# Patient Record
Sex: Male | Born: 1995 | Race: White | Hispanic: No | Marital: Single | Smoking: Never smoker
Health system: Southern US, Community
[De-identification: ages and names within clinical notes are randomized; demographics above are authoritative.]

## PROBLEM LIST (undated history)

## (undated) DIAGNOSIS — S060XAA Concussion with loss of consciousness status unknown, initial encounter: Secondary | ICD-10-CM

## (undated) DIAGNOSIS — J302 Other seasonal allergic rhinitis: Secondary | ICD-10-CM

## (undated) DIAGNOSIS — S060X9A Concussion with loss of consciousness of unspecified duration, initial encounter: Secondary | ICD-10-CM

## (undated) DIAGNOSIS — A770 Spotted fever due to Rickettsia rickettsii: Secondary | ICD-10-CM

## (undated) DIAGNOSIS — M545 Low back pain, unspecified: Secondary | ICD-10-CM

## (undated) HISTORY — DX: Concussion with loss of consciousness of unspecified duration, initial encounter: S06.0X9A

## (undated) HISTORY — DX: Other seasonal allergic rhinitis: J30.2

## (undated) HISTORY — DX: Low back pain, unspecified: M54.50

## (undated) HISTORY — DX: Low back pain: M54.5

## (undated) HISTORY — DX: Concussion with loss of consciousness status unknown, initial encounter: S06.0XAA

## (undated) HISTORY — DX: Spotted fever due to Rickettsia rickettsii: A77.0

---
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
## 2017-06-19 ENCOUNTER — Telehealth: Payer: Self-pay | Admitting: Family Medicine

## 2017-06-19 NOTE — Telephone Encounter (Signed)
 if possible, December is fine.  Tell him I hope his fall semester goes well.  Thanks.

## 2017-06-19 NOTE — Telephone Encounter (Signed)
Best number [phone]   Mom called to r/s new patient with you.  Pt started @ [employer] and he goes to school all day and wanted to r/s   Your next new patient appointment is 03/06/18.  Mom wanted to know if you could see pt in Dec  He has a break from school the whole month of dec  Ok to schedule?

## 2017-06-19 NOTE — Telephone Encounter (Signed)
Left message asking pt to call office  °

## 2017-06-20 ENCOUNTER — Ambulatory Visit: Payer: Self-pay | Admitting: Family Medicine

## 2017-06-20 NOTE — Telephone Encounter (Signed)
appointment 12/4  Pt aware

## 2017-09-26 ENCOUNTER — Ambulatory Visit (INDEPENDENT_AMBULATORY_CARE_PROVIDER_SITE_OTHER): Payer: Self-pay | Admitting: Family Medicine

## 2017-09-26 ENCOUNTER — Encounter: Payer: Self-pay | Admitting: Family Medicine

## 2017-09-26 DIAGNOSIS — Z7189 Other specified counseling: Secondary | ICD-10-CM

## 2017-09-26 DIAGNOSIS — Z0000 Encounter for general adult medical examination without abnormal findings: Secondary | ICD-10-CM

## 2017-09-26 NOTE — Patient Instructions (Addendum)
Use debrox to loosen the wax if needed then irrigate with warm water.  Don't use Qtips.   Thanks for getting a flu shot.  Update me as needed.  No charge for visit.
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
## 2017-10-04 ENCOUNTER — Encounter: Payer: Self-pay | Admitting: Family Medicine

## 2017-10-04 DIAGNOSIS — Z7189 Other specified counseling: Secondary | ICD-10-CM | POA: Insufficient documentation

## 2017-10-04 DIAGNOSIS — Z0000 Encounter for general adult medical examination without abnormal findings: Secondary | ICD-10-CM | POA: Insufficient documentation

## 2017-10-04 NOTE — Assessment & Plan Note (Signed)
HIV screen discussed with patient.  He will consider. Tetanus shot done approximately 2013. Flu shot done 07/2027 Advanced directive discussed with patient.  Parents equally designated if patient were incapacitated. Healthy habits discussed with patient.  The patient mainly presented today to get established with the clinic.  He has no acute concerns.  His health maintenance is otherwise up-to-date and he needs no intervention at this point.  I no-charged the visit. D/w pt.  Update me as needed.

## 2017-10-04 NOTE — Assessment & Plan Note (Signed)
Advanced directive discussed with patient.  Parents equally designated if patient were incapacitated. 

## 2017-11-17 ENCOUNTER — Encounter: Payer: Self-pay | Admitting: Family Medicine

## 2017-11-17 ENCOUNTER — Ambulatory Visit: Payer: Self-pay | Admitting: Family Medicine

## 2017-11-17 ENCOUNTER — Ambulatory Visit (INDEPENDENT_AMBULATORY_CARE_PROVIDER_SITE_OTHER)
Admission: RE | Admit: 2017-11-17 | Discharge: 2017-11-17 | Disposition: A | Discharge status: Home / Self Care | Payer: Self-pay | Source: Ambulatory Visit | Attending: Family Medicine | Admitting: Family Medicine

## 2017-11-17 VITALS — BP 96/68 | HR 70 | Temp 98.6°F | Wt 153.8 lb

## 2017-11-17 DIAGNOSIS — M79672 Pain in left foot: Secondary | ICD-10-CM

## 2017-11-17 DIAGNOSIS — M79606 Pain in leg, unspecified: Secondary | ICD-10-CM

## 2017-11-17 NOTE — Patient Instructions (Addendum)
See if a chopat strap helps with the right leg pain.  Ice the area, 5 minutes at a time.   I don't see a fracture on your films.  We'll get the radiology department to overread it.   Try a lace up ankle brace in the meantime.  Take care.  Glad to see you.  Update me as needed.

## 2017-11-17 NOTE — Progress Notes (Signed)
R proximal tibial pain.  Was playing street hockey.  Hit by the ball in the area.  Was able to keep playing.  This was a few weeks ago.  Able to bear weight.  Also had rolled his L ankle around the same time.  Felt a pop at the time, this was a few weeks ago. Pain with running or with rolling the L foot.  Still able to bear weight.  He wanted to let the areas heal up but haven't resolved in the meantime, so he came in for evaluation.  Meds, vitals, and allergies reviewed.   ROS: Per HPI unless specifically indicated in ROS section   nad ncat R knee with normal inspection, normal range of motion.  Joint line nontender to palpation.  Patella not tender.  Normal patellar tracking.  ACL MCL and LCL all feel solid. Able to do R one- legged squat.  Minimal tenderness near the tibial tubercle.   Left foot and ankle with normal inspection.  Mortise feels solid.  Medial and lateral malleoli are not tender.  No bruising.  No swelling.  Normal dorsalis pedis pulse.  Foot is not tender to palpation initially but some pain with weight bearing on the L foot, mid foot.

## 2017-11-19 DIAGNOSIS — M79606 Pain in leg, unspecified: Secondary | ICD-10-CM | POA: Insufficient documentation

## 2017-11-19 DIAGNOSIS — M79672 Pain in left foot: Secondary | ICD-10-CM | POA: Insufficient documentation

## 2017-11-19 NOTE — Assessment & Plan Note (Addendum)
I don't see a fracture on his films.  Likely a strain/soft tissue injury.  See notes on imaging.  Okay to try a lace up ankle brace in the meantime. Update me as needed.  He agrees.

## 2017-11-19 NOTE — Assessment & Plan Note (Signed)
He could have had irritation at the insertion of the quad ligament.  Unlikely to have a fracture given his exam and timeline. He can see if a chopat strap helps with the right leg pain.  Ice the area, 5 minutes at a time.   Update me as needed.  He agrees.

## 2017-12-13 ENCOUNTER — Ambulatory Visit: Payer: Self-pay | Admitting: Family Medicine

## 2017-12-13 ENCOUNTER — Encounter: Payer: Self-pay | Admitting: Family Medicine

## 2017-12-13 VITALS — BP 120/68 | HR 90 | Temp 98.9°F | Wt 148.0 lb

## 2017-12-13 DIAGNOSIS — A084 Viral intestinal infection, unspecified: Secondary | ICD-10-CM | POA: Insufficient documentation

## 2017-12-13 MED ORDER — ONDANSETRON HCL 4 MG PO TABS: 4.0000 mg | ORAL_TABLET | Freq: Three times a day (TID) | ORAL | 0 refills | Status: DC | PRN

## 2017-12-13 NOTE — Progress Notes (Signed)
BP 120/68 (BP Location: Left Arm, Patient Position: Sitting, Cuff Size: Normal)   Pulse 90   Temp 98.9 F (37.2 C) (Oral)   Wt 148 lb (67.1 kg)   SpO2 97%   BMI 22.18 kg/m    CC: diarrhea Subjective:    Patient ID: Jeffrey Church, male    DOB: 01/03/1996, 22 y.o.   MRN: [id number]  HPI: Jeffrey BladeJonathan Grogan is a 22 y.o. male presenting on 12/13/2017 for Diarrhea (Started 3 days ago, not improving.  Has had a lot of gas. Tried Imodium. Pt accompanied by mother.); Emesis (Started last night.); Fatigue; and Fever (Max, 100.7 this morning. Pt recently exposed to flu.)   Here with mom Bess.  3d h/o watery diarrhea 6-7 times/day, nausea/vomiting last night 2am (NBNB), upset stomach and bloating "like gas", fever to 100.7 this morning. Ongoing HA for 3 days as well. Felt better with passing gas. Fatigued. Appetite good.   No blood in stool, cough, ST, ear or tooth pain, body aches.  So far tried imodium with some improvement - up to 4 a day without improvement.  Thinks staying well hydrated. Has tried pedialyte.   [facility] [employer] - sick contacts at school.  No new restaurants, no new foods.  No sick contacts at home.  Uses well water.  Has been able to go to class through this week.   Relevant past medical, surgical, family and social history reviewed and updated as indicated. Interim medical history since our last visit reviewed. Allergies and medications reviewed and updated. Outpatient Medications Prior to Visit  Medication Sig Dispense Refill  . cetirizine (ZYRTEC) 10 MG tablet Take 10 mg by mouth daily as needed for allergies.    . diphenhydrAMINE (BENADRYL) 25 MG tablet Take 25 mg by mouth as needed.     No facility-administered medications prior to visit.      Per HPI unless specifically indicated in ROS section below Review of Systems     Objective:    BP 120/68 (BP Location: Left Arm, Patient Position: Sitting, Cuff Size: Normal)   Pulse 90   Temp 98.9 F (37.2 C)  (Oral)   Wt 148 lb (67.1 kg)   SpO2 97%   BMI 22.18 kg/m   Wt Readings from Last 3 Encounters:  12/13/17 148 lb (67.1 kg) (42 %, Z= -0.21)*  11/17/17 153 lb 12 oz (69.7 kg) (52 %, Z= 0.04)*  09/26/17 155 lb 4 oz (70.4 kg) (55 %, Z= 0.12)*   * Growth percentiles are based on CDC (Boys, 2-20 Years) data.    Physical Exam  Constitutional: He appears well-developed and well-nourished. No distress.  HENT:  Head: Normocephalic and atraumatic.  Right Ear: Hearing, tympanic membrane, external ear and ear canal normal.  Left Ear: Hearing, tympanic membrane, external ear and ear canal normal.  Nose: Nose normal. No mucosal edema or rhinorrhea. Right sinus exhibits no maxillary sinus tenderness and no frontal sinus tenderness. Left sinus exhibits no maxillary sinus tenderness and no frontal sinus tenderness.  Mouth/Throat: Uvula is midline, oropharynx is clear and moist and mucous membranes are normal. No oropharyngeal exudate, posterior oropharyngeal edema, posterior oropharyngeal erythema or tonsillar abscesses.  Eyes: Conjunctivae and EOM are normal. Pupils are equal, round, and reactive to light. No scleral icterus.  Neck: Normal range of motion. Neck supple.  Cardiovascular: Normal rate, regular rhythm, normal heart sounds and intact distal pulses.  No murmur heard. Pulmonary/Chest: Effort normal and breath sounds normal. No respiratory distress. He has no  wheezes. He has no rales.  Abdominal: Soft. Normal appearance and bowel sounds are normal. He exhibits no distension and no mass. There is no hepatosplenomegaly. There is generalized tenderness (mild). There is no rigidity, no rebound, no guarding, no CVA tenderness and negative Murphy's sign.  Lymphadenopathy:    He has no cervical adenopathy.  Skin: Skin is warm and dry. No rash noted.  Psychiatric: He has a normal mood and affect.  Nursing note and vitals reviewed.  No results found for this or any previous visit.    Assessment &  Plan:   Problem List Items Addressed This Visit    Viral gastroenteritis - Primary    Diarrhea x 3 days that may be improving today (more formed stools this morning) as well as isolated episode of emesis this am. Anticipate viral gastroenteritis. Supportive care reviewed. zofran PRN nausea, zantac for stomach upset, continue imodium PRN. Red flags to update [place] for GI pathogen panel reviewed.  Pt and mom agree with plan.           Meds ordered this encounter  Medications  . ondansetron (ZOFRAN) 4 MG tablet    Sig: Take 1 tablet (4 mg total) by mouth every 8 (eight) hours as needed for nausea or vomiting.    Dispense:  20 tablet    Refill:  0   No orders of the defined types were placed in this encounter.   Follow up plan: Return if symptoms worsen or fail to improve.  Eustaquio Boyden, MD

## 2017-12-13 NOTE — Assessment & Plan Note (Addendum)
Diarrhea x 3 days that may be improving today (more formed stools this morning) as well as isolated episode of emesis this am. Anticipate viral gastroenteritis. Supportive care reviewed. zofran PRN nausea, zantac for stomach upset, continue imodium PRN. Red flags to update us for GI pathogen panel reviewed.  Pt and mom agree with plan.

## 2017-12-13 NOTE — Patient Instructions (Signed)
I think you have viral stomach bug - treat supportively. Push sips of fluids, clear liquids over rest of day then advance diet as tolerated (bland diet) May use zofran anti nausea medicine as well as over the counter zantac for upset stomach.  Let us know if not improving over next 24-48 hours or if persistent fever >101 or diarrhea/vomiting.  Viral Gastroenteritis, Adult Viral gastroenteritis is also known as the stomach flu. This condition is caused by various viruses. These viruses can be passed from person to person very easily (are very contagious). This condition may affect your stomach, small intestine, and large intestine. It can cause sudden watery diarrhea, fever, and vomiting. Diarrhea and vomiting can make you feel weak and cause you to become dehydrated. You may not be able to keep fluids down. Dehydration can make you tired and thirsty, cause you to have a dry mouth, and decrease how often you urinate. Older adults and people with other diseases or a weak immune system are at higher risk for dehydration. It is important to replace the fluids that you lose from diarrhea and vomiting. If you become severely dehydrated, you may need to get fluids through an IV tube. What are the causes? Gastroenteritis is caused by various viruses, including rotavirus and norovirus. Norovirus is the most common cause in adults. You can get sick by eating food, drinking water, or touching a surface contaminated with one of these viruses. You can also get sick from sharing utensils or other personal items with an infected person. What increases the risk? This condition is more likely to develop in people:  Who have a weak defense system (immune system).  Who live with one or more children who are younger than 67 years old.  Who live in a nursing home.  Who go on cruise ships.  What are the signs or symptoms? Symptoms of this condition start suddenly 1-2 days after exposure to a virus. Symptoms may last  a few days or as long as a week. The most common symptoms are watery diarrhea and vomiting. Other symptoms include:  Fever.  Headache.  Fatigue.  Pain in the abdomen.  Chills.  Weakness.  Nausea.  Muscle aches.  Loss of appetite.  How is this diagnosed? This condition is diagnosed with a medical history and physical exam. You may also have a stool test to check for viruses or other infections. How is this treated? This condition typically goes away on its own. The focus of treatment is to restore lost fluids (rehydration). Your health care provider may recommend that you take an oral rehydration solution (ORS) to replace important salts and minerals (electrolytes) in your body. Severe cases of this condition may require giving fluids through an IV tube. Treatment may also include medicine to help with your symptoms. Follow these instructions at home: Follow instructions from your health care provider about how to care for yourself at home. Eating and drinking Follow these recommendations as told by your health care provider:  Take an ORS. This is a drink that is sold at pharmacies and retail stores.  Drink clear fluids in small amounts as you are able. Clear fluids include water, ice chips, diluted fruit juice, and low-calorie sports drinks.  Eat bland, easy-to-digest foods in small amounts as you are able. These foods include bananas, applesauce, rice, lean meats, toast, and crackers.  Avoid fluids that contain a lot of sugar or caffeine, such as energy drinks, sports drinks, and soda.  Avoid alcohol.  Avoid  spicy or fatty foods.  General instructions   Drink enough fluid to keep your urine clear or pale yellow.  Wash your hands often. If soap and water are not available, use hand sanitizer.  Make sure that all people in your household wash their hands well and often.  Take over-the-counter and prescription medicines only as told by your health care  provider.  Rest at home while you recover.  Watch your condition for any changes.  Take a warm bath to relieve any burning or pain from frequent diarrhea episodes.  Keep all follow-up visits as told by your health care provider. This is important. Contact a health care provider if:  You cannot keep fluids down.  Your symptoms get worse.  You have new symptoms.  You feel light-headed or dizzy.  You have muscle cramps. Get help right away if:  You have chest pain.  You feel extremely weak or you faint.  You see blood in your vomit.  Your vomit looks like coffee grounds.  You have bloody or black stools or stools that look like tar.  You have a severe headache, a stiff neck, or both.  You have a rash.  You have severe pain, cramping, or bloating in your abdomen.  You have trouble breathing or you are breathing very quickly.  Your heart is beating very quickly.  Your skin feels cold and clammy.  You feel confused.  You have pain when you urinate.  You have signs of dehydration, such as: ? Dark urine, very little urine, or no urine. ? Cracked lips. ? Dry mouth. ? Sunken eyes. ? Sleepiness. ? Weakness. This information is not intended to replace advice given to you by your health care provider. Make sure you discuss any questions you have with your health care provider. Document Released: 10/10/2005 Document Revised: 03/23/2016 Document Reviewed: 06/16/2015 Elsevier Interactive Patient Education  [employer]2018 Elsevier Inc.

## 2018-01-18 ENCOUNTER — Encounter: Payer: Self-pay | Admitting: Family Medicine

## 2018-01-18 ENCOUNTER — Ambulatory Visit: Payer: Self-pay | Admitting: Family Medicine

## 2018-01-18 VITALS — BP 124/78 | HR 97 | Temp 98.9°F | Ht 69.0 in | Wt 151.8 lb

## 2018-01-18 DIAGNOSIS — R6889 Other general symptoms and signs: Secondary | ICD-10-CM

## 2018-01-18 MED ORDER — OSELTAMIVIR PHOSPHATE 75 MG PO CAPS: 75.0000 mg | ORAL_CAPSULE | Freq: Two times a day (BID) | ORAL | 0 refills | Status: DC

## 2018-01-18 MED ORDER — BENZONATATE 200 MG PO CAPS: 200.0000 mg | ORAL_CAPSULE | Freq: Two times a day (BID) | ORAL | 0 refills | Status: DC | PRN

## 2018-01-18 MED ORDER — ALBUTEROL SULFATE HFA 108 (90 BASE) MCG/ACT IN AERS: 2.0000 | INHALATION_SPRAY | Freq: Four times a day (QID) | RESPIRATORY_TRACT | 0 refills | Status: AC | PRN

## 2018-01-18 MED ORDER — IPRATROPIUM BROMIDE 0.06 % NA SOLN: 2.0000 | Freq: Four times a day (QID) | NASAL | 0 refills | Status: DC

## 2018-01-18 NOTE — Patient Instructions (Signed)
We will treat your for flu.  Start the tamiflu.  Use the albuterol and cough med.  Please stay well hydrated.  You can take tylenol and/or motrin as needed for low grade fever and pain.  Please let me know if your symptoms worsen or fail to improve.  Take care, Dr Jimmey RalphParker

## 2018-01-18 NOTE — Progress Notes (Signed)
    Subjective:  Jeffrey Church is a 22 y.o. male who presents today for same-day appointment with a chief complaint of cough.   HPI:  Cough, acute issue Started 2 days ago. Worsening today. Associated with fevers, myalgias, headache, and fever of 102.44F.  Check taking Aleve which has helped a little bit.  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   ROS: Per HPI  PMH: He reports that he has never smoked. He has never used smokeless tobacco. He reports that he does not drink alcohol or use drugs.  Objective:  Physical Exam: BP 124/78 (BP Location: Right Arm, Patient Position: Sitting, Cuff Size: Normal)   Pulse 97   Temp 98.9 F (37.2 C) (Oral)   Ht 5\' 9"  (1.753 m)   Wt 151 lb 12.8 oz (68.9 kg)   SpO2 97%   BMI 22.42 kg/m   Gen: NAD, resting comfortably HEENT: Right TM clear.  Left TM with clear effusion. Nasal mucosa erythematous with thick, white nasal discharge.  Oropharynx erythematous without exudate. CV: RRR with no murmurs appreciated Pulm: NWOB, CTAB with no crackles, wheezes, or rhonchi  Assessment/Plan:  Flulike symptoms We will treat empirically for flu-we do not have rapid testing available today.  Start Tamiflu 75 mg twice daily for 5 days.  We will also start Atrovent for rhinorrhea and sinus congestion.   Start Tessalon for his cough.  Patient has a history of asthma, but does not have any wheezing on exam today and his respiratory exam is otherwise stable.  I will refill his albuterol for him.  Encouraged good oral hydration.  Recommended over-the-counter Tylenol and/or Motrin as needed for low-grade fever and pain.  Katina Degreealeb M. Jimmey RalphParker, MD 01/18/2018 4:03 PM

## 2018-03-06 ENCOUNTER — Ambulatory Visit: Payer: Self-pay | Admitting: Family Medicine

## 2020-05-05 ENCOUNTER — Ambulatory Visit: Payer: Self-pay | Admitting: Family Medicine

## 2020-05-07 ENCOUNTER — Encounter: Payer: Self-pay | Admitting: Family Medicine

## 2020-05-07 ENCOUNTER — Ambulatory Visit: Payer: Managed Care, Other (non HMO) | Admitting: Family Medicine

## 2020-05-07 ENCOUNTER — Other Ambulatory Visit: Payer: Self-pay

## 2020-05-07 VITALS — BP 120/82 | HR 76 | Temp 98.2°F | Ht 69.0 in | Wt 172.6 lb

## 2020-05-07 DIAGNOSIS — R519 Headache, unspecified: Secondary | ICD-10-CM

## 2020-05-07 MED ORDER — IPRATROPIUM BROMIDE 0.06 % NA SOLN: 2.0000 | Freq: Four times a day (QID) | NASAL | Status: AC | PRN

## 2020-05-07 MED ORDER — PSEUDOEPHEDRINE HCL 30 MG PO TABS: 30.0000 mg | ORAL_TABLET | Freq: Every day | ORAL | Status: DC | PRN

## 2020-05-07 MED ORDER — NAPROXEN SODIUM 220 MG PO TABS: 220.0000 mg | ORAL_TABLET | Freq: Every day | ORAL | Status: AC | PRN

## 2020-05-07 MED ORDER — LEVOCETIRIZINE DIHYDROCHLORIDE 5 MG PO TABS: 5.0000 mg | ORAL_TABLET | Freq: Every day | ORAL | Status: AC | PRN
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
## 2020-05-07 NOTE — Patient Instructions (Signed)
We'll call about getting the MRI done.  Avoid triggers in the meantime.   We may need to have you see neurology but I want to get your MRI done first.  Take care.  Glad to see you.

## 2020-05-08 DIAGNOSIS — R519 Headache, unspecified: Secondary | ICD-10-CM | POA: Insufficient documentation

## 2020-05-08 NOTE — Assessment & Plan Note (Signed)
Likely two separate issues.  HA with orgasm x3, but not o/w.  Normal neuro exam.  D/w pt about ddx.  No acute symptoms.  Needs MRA to exclude vascular pathology.  Still okay for outpatient f/u.   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  Would get imaging done first, may need referral to neuro but it makes sense to get imaging done first.  Ddx d/w pt.  He agrees.    At least 30 minutes were devoted to patient care in this encounter (this can potentially include time spent reviewing the patient's file/history, interviewing and examining the patient, counseling/reviewing plan with patient, ordering referrals, ordering tests, reviewing relevant laboratory or x-ray data, and documenting the encounter).

## 2020-05-28 ENCOUNTER — Other Ambulatory Visit: Payer: Self-pay

## 2020-05-28 ENCOUNTER — Ambulatory Visit
Admission: RE | Admit: 2020-05-28 | Discharge: 2020-05-28 | Disposition: A | Discharge status: Home / Self Care | Payer: Managed Care, Other (non HMO) | Source: Ambulatory Visit | Attending: Family Medicine | Admitting: Family Medicine

## 2020-05-28 DIAGNOSIS — R519 Headache, unspecified: Secondary | ICD-10-CM | POA: Diagnosis not present

## 2020-05-29 ENCOUNTER — Telehealth: Payer: Self-pay | Admitting: Family Medicine

## 2020-05-29 NOTE — Telephone Encounter (Signed)
Patient has been notified of these results.  Patient states that he is no longer having headaches - and states he is overall feeling well. Will route to Dr. Para March.

## 2020-05-29 NOTE — Telephone Encounter (Signed)
Patient called in for results. Transferred to [employer].

## 2020-05-31 NOTE — Telephone Encounter (Signed)
Noted. Thanks.  I wouldn't do anything else at this point.  Update me as needed.

## 2020-07-21 ENCOUNTER — Other Ambulatory Visit: Payer: Managed Care, Other (non HMO)

## 2021-05-12 IMAGING — MR MR MRA HEAD W/O CM
1 series · 20 of 48 positions shown · non-contrast
Comparison: None.

CLINICAL DATA: Exertional headache

EXAM:
MRA HEAD WITHOUT CONTRAST
TECHNIQUE: Angiographic images of the Circle of Willis were obtained using MRA
technique without intravenous contrast.

[Series 9: TOF · axial · 0.5mm · 0.41mm/px · z∈[-78,+19]mm · 20 of 205 slices shown]
[im 1/205]
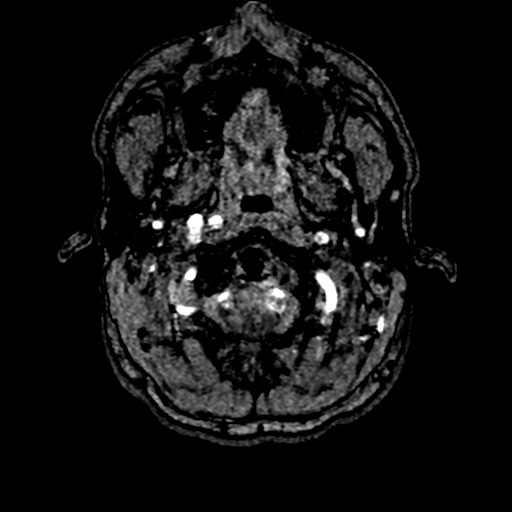
[im 5/205]
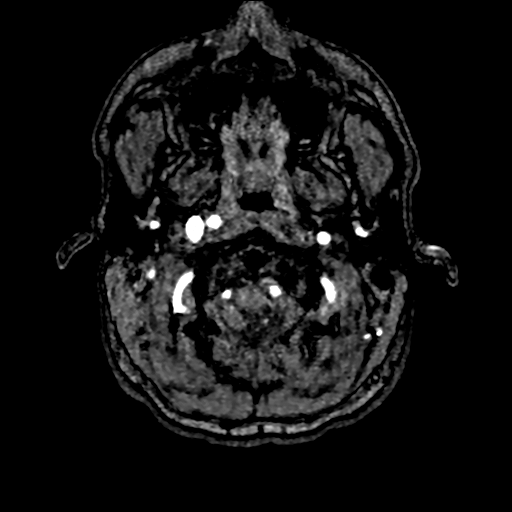
[im 9/205]
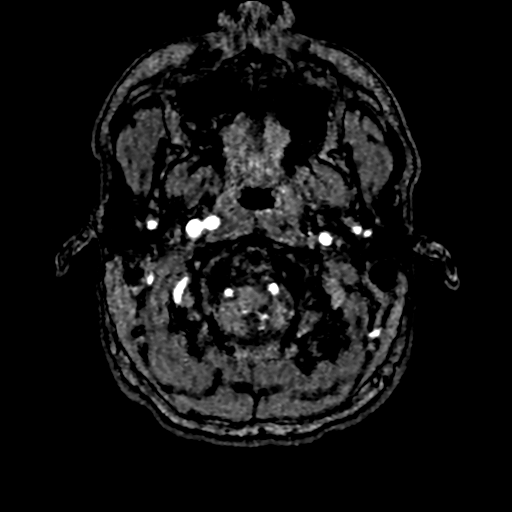
[im 14/205]
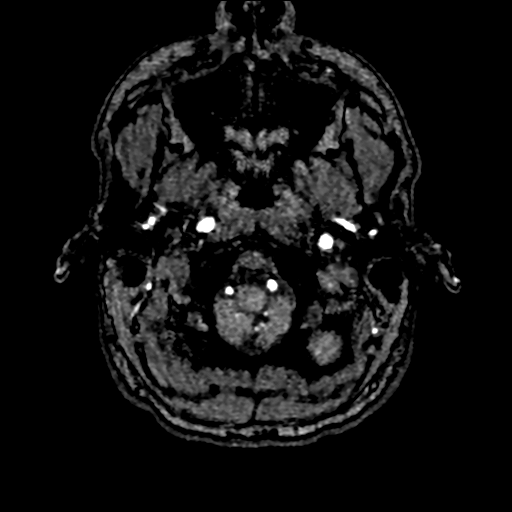
[im 18/205]
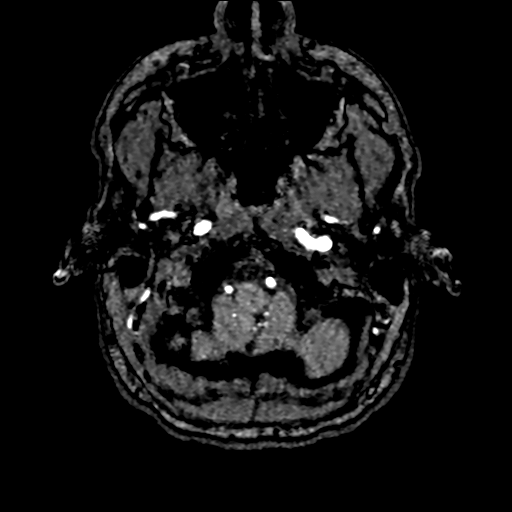
[im 22/205]
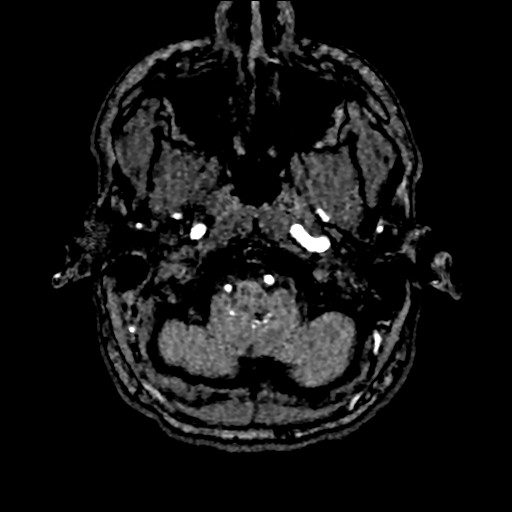
[im 27/205]
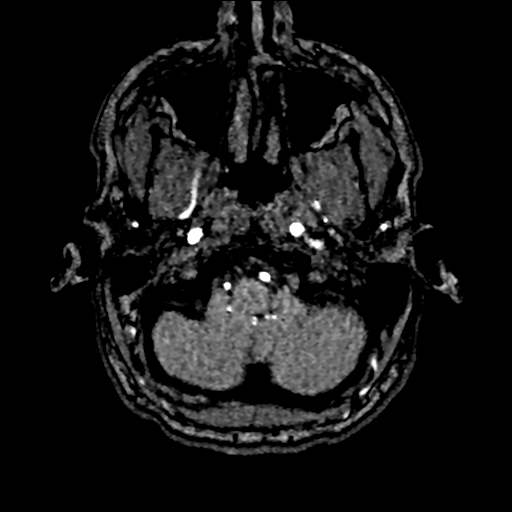
[im 31/205]
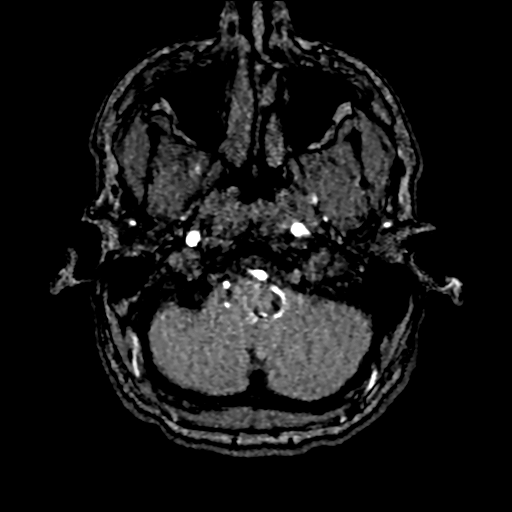
[im 35/205]
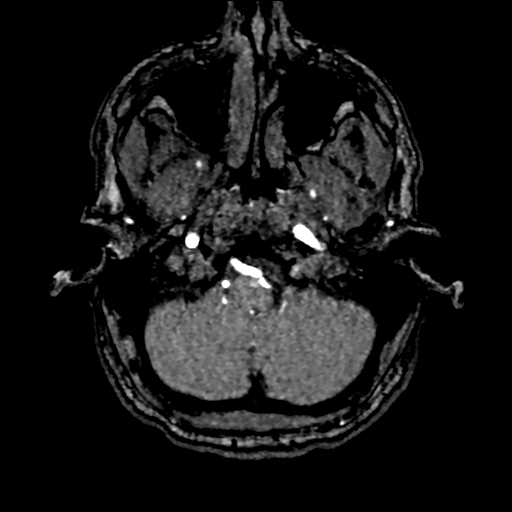
[im 40/205]
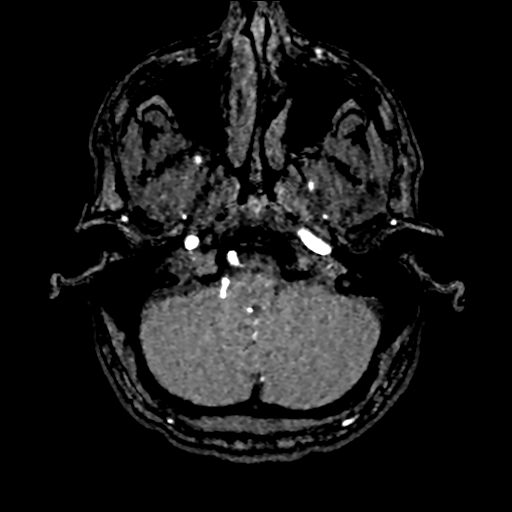
[im 44/205]
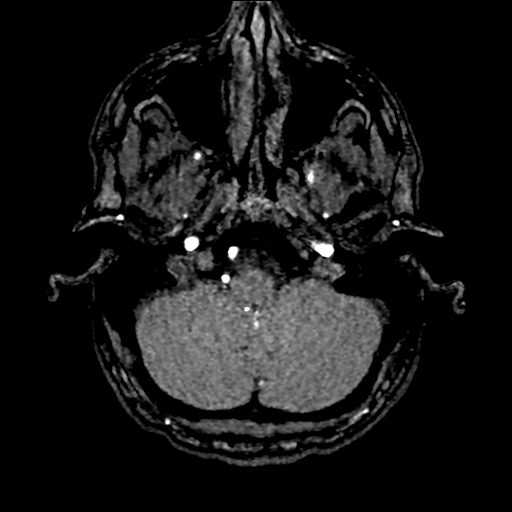
[im 48/205]
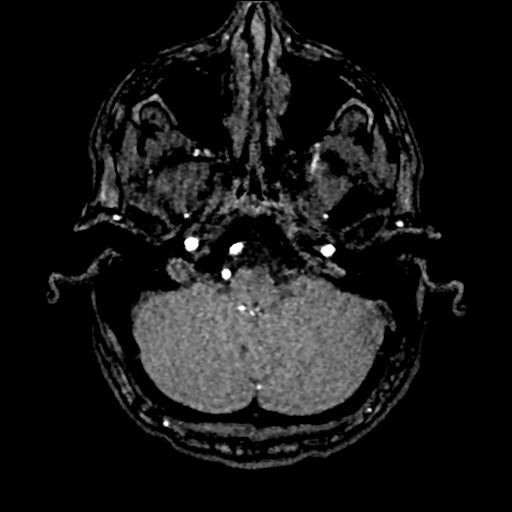
[im 66/205]
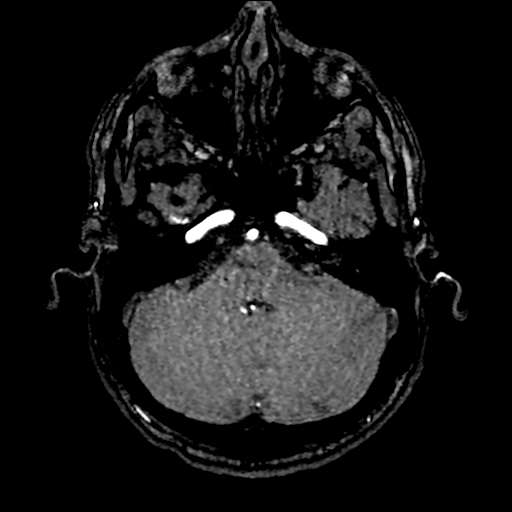
[im 92/205]
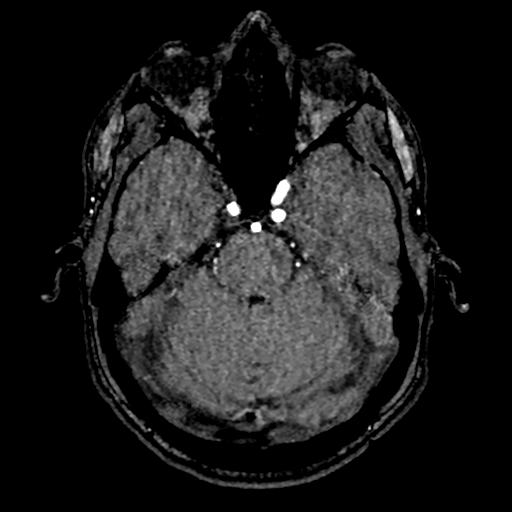
[im 105/205]
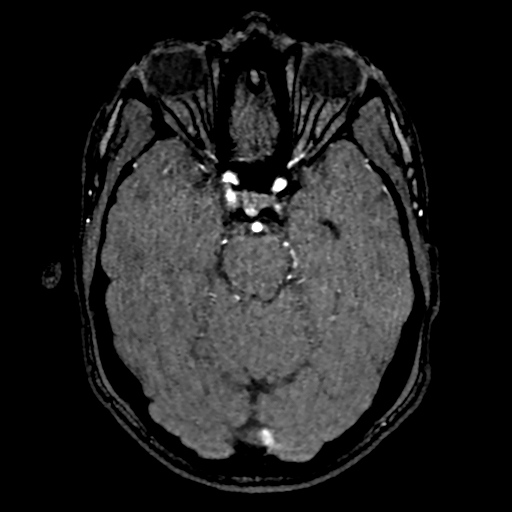
[im 118/205]
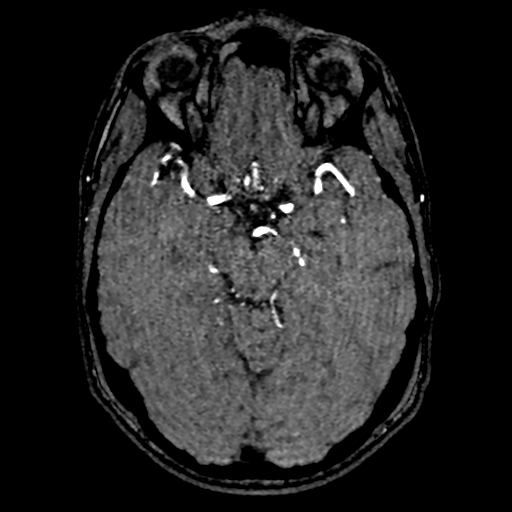
[im 144/205]
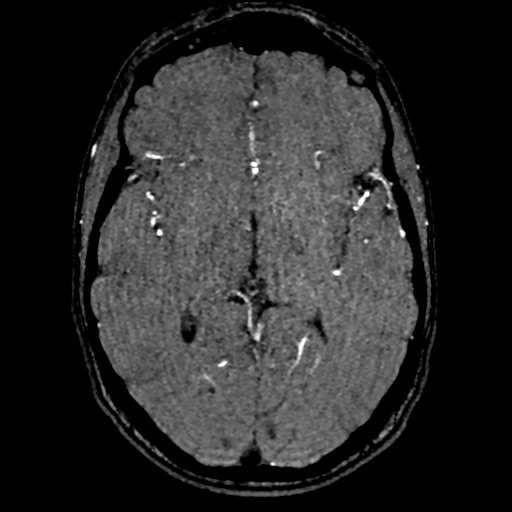
[im 170/205]
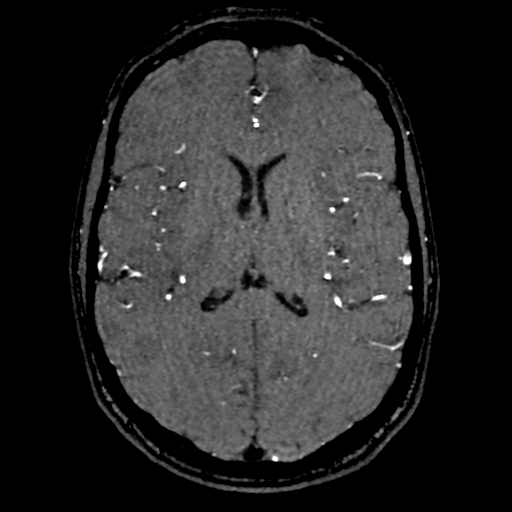
[im 174/205]
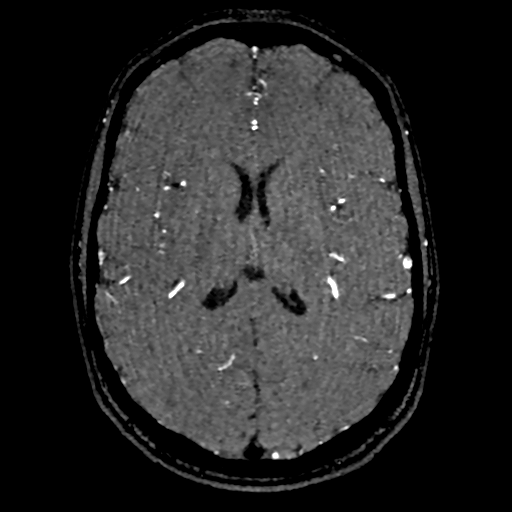
[im 196/205]
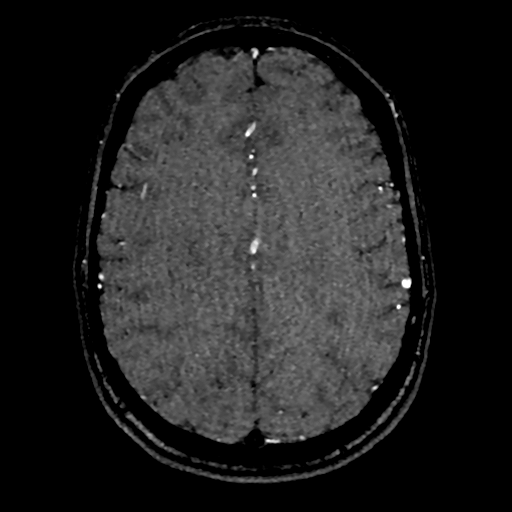

[20 of 48 positions shown; findings below may reference images not displayed]

FINDINGS: Intracranial internal carotid arteries are patent. Middle and
anterior cerebral arteries are patent. Intracranial vertebral
arteries, basilar artery, posterior cerebral arteries are patent. A
posterior communicating artery is present on the left. There is no
significant stenosis or aneurysm.
IMPRESSION: Normal MRA of the head.

## 2021-06-25 ENCOUNTER — Ambulatory Visit (INDEPENDENT_AMBULATORY_CARE_PROVIDER_SITE_OTHER): Payer: Managed Care, Other (non HMO) | Admitting: Family Medicine

## 2021-06-25 ENCOUNTER — Telehealth: Payer: Self-pay | Admitting: Family Medicine

## 2021-06-25 ENCOUNTER — Encounter: Payer: Self-pay | Admitting: Family Medicine

## 2021-06-25 ENCOUNTER — Other Ambulatory Visit: Payer: Self-pay

## 2021-06-25 VITALS — BP 118/70 | HR 73 | Temp 98.7°F | Ht 69.0 in | Wt 175.0 lb

## 2021-06-25 DIAGNOSIS — Z23 Encounter for immunization: Secondary | ICD-10-CM | POA: Diagnosis not present

## 2021-06-25 DIAGNOSIS — Z0000 Encounter for general adult medical examination without abnormal findings: Secondary | ICD-10-CM

## 2021-06-25 DIAGNOSIS — Z029 Encounter for administrative examinations, unspecified: Secondary | ICD-10-CM

## 2021-06-25 DIAGNOSIS — Z7189 Other specified counseling: Secondary | ICD-10-CM

## 2021-06-25 NOTE — Telephone Encounter (Signed)
Forms placed in your in box.

## 2021-06-25 NOTE — Progress Notes (Signed)
This visit occurred during the SARS-CoV-2 public health emergency.  Safety protocols were in place, including screening questions prior to the visit, additional usage of staff PPE, and extensive cleaning of exam room while observing appropriate contact time as indicated for disinfecting solutions.  CPE- See plan.  Routine anticipatory guidance given to patient.  See health maintenance.  The possibility exists that previously documented standard health maintenance information may have been brought forward from a previous encounter into this note.  If needed, that same information has been updated to reflect the current situation based on today's encounter.    Tetanus 2022 Flu shot 2022 PNA and shingles not due.   Covid vaccine prev done.   Advanced directive discussed with patient.  Parents equally designated if patient were incapacitated. Diet and exercise d/w pt.  Doing well with both.    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  He'll drop of the paperwork for me to address.  See scanned forms.  No history of injury or recent symptoms that would prevent participation in his program, discussed.  No more HA in the meantime, d/w pt.    No SABA use in the last year.  No recent atrovent nasal spray use.  Prn use of xyzal and naproxen.    PMH and SH reviewed  Meds, vitals, and allergies reviewed.   ROS: Per HPI.  Unless specifically indicated otherwise in HPI, the patient denies:  General: fever. Eyes: acute vision changes ENT: sore throat Cardiovascular: chest pain Respiratory: SOB GI: vomiting GU: dysuria Musculoskeletal: acute back pain Derm: acute rash Neuro: acute motor dysfunction Psych: worsening mood Endocrine: polydipsia Heme: bleeding Allergy: hayfever  GEN: nad, alert and oriented HEENT: ncat NECK: supple w/o LA CV: rrr. PULM: ctab, no inc wob ABD: soft, +bs EXT: no edema SKIN: no acute rash

## 2021-06-25 NOTE — Telephone Encounter (Signed)
Pt came info office to drop off immunization form. Pt stated if the provider had any questions to call him [phone]. Placed in provider box

## 2021-06-25 NOTE — Patient Instructions (Signed)
Good luck with the program.   Flu and tetanus shot today.  Go to the lab on the way out.   If you have mychart we'll likely use that to update you.    Send me the papers and I'll work on them.   Take care.  Glad to see you.

## 2021-06-25 NOTE — Telephone Encounter (Signed)
I'll work on it.  Thanks.  

## 2021-06-28 NOTE — Assessment & Plan Note (Signed)
  Tetanus 2022 Flu shot 2022 PNA and shingles not due.   Covid vaccine prev done.   Advanced directive discussed with patient.  Parents equally designated if patient were incapacitated. Diet and exercise d/w pt.  Doing well with both.    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  He'll drop of the paperwork for me to address.  See scanned forms.  No history of injury or recent symptoms that would prevent participation in his program, discussed.  No more HA in the meantime, d/w pt.    No SABA use in the last year.  No recent atrovent nasal spray use.  Prn use of xyzal and naproxen.

## 2021-06-28 NOTE — Assessment & Plan Note (Signed)
Advanced directive discussed with patient.  Parents equally designated if patient were incapacitated.

## 2021-07-01 LAB — MEASLES/MUMPS/RUBELLA IMMUNITY
Mumps IgG: >300 AU/mL
Rubella: 6.94 Index
Rubeola IgG: >300 AU/mL

## 2021-07-01 LAB — QUANTIFERON-TB GOLD PLUS
Mitogen-NIL: >10 IU/mL
NIL: 0.04 IU/mL
QuantiFERON-TB Gold Plus: NEGATIVE
TB1-NIL: 0 IU/mL
TB2-NIL: 0 IU/mL

## 2021-07-01 LAB — HEPATITIS B SURFACE ANTIBODY, QUANTITATIVE: Hep B S AB Quant (Post): 11 m[IU]/mL (ref >=10)

## 2021-07-01 LAB — VARICELLA ZOSTER ANTIBODY, IGG: Varicella IgG: 3077 index

## 2021-08-11 ENCOUNTER — Encounter: Payer: Self-pay | Admitting: Family Medicine

## 2021-09-24 ENCOUNTER — Telehealth: Payer: Self-pay | Admitting: Family Medicine

## 2021-09-24 NOTE — Telephone Encounter (Signed)
Lea- please talk to me about this.    Shanda Bumps- please let him know I am working on this.  I thought I did the routine billing and I had no expectation that he would get a bill like this.  Thanks.

## 2021-09-24 NOTE — Telephone Encounter (Signed)
I have called patient and let him know that we have sent to have something to take a look at it. If he has not had call back by end of next week to reach back out to [place] to follow up

## 2021-09-24 NOTE — Telephone Encounter (Signed)
Pt called stating that he got a bill for 928.01 on his visit on 06/25/21. Pt is asking if Dr Para March can check and see if there is anything that he could do differently so that the bill wouldn't be so high.

## 2021-09-26 NOTE — Telephone Encounter (Signed)
Thanks

## 2021-09-29 NOTE — Telephone Encounter (Signed)
Called and spoke to patient and gave him info before. He said that his bill was with [facility] I told him he needed to contact [facility] billing.

## 2021-09-29 NOTE — Telephone Encounter (Signed)
Lea- is there anything from our end that can be done about his billing from [facility]?  Please let me know.  Thanks.

## 2021-10-01 NOTE — Telephone Encounter (Signed)
Jeffrey Church  You 21 hours ago (12:15 PM)   LG I have sent a message to [facility] to find out can be done. I will follow up when I hear back from them.     =========== Noted. Thanks.

## 2021-12-15 ENCOUNTER — Telehealth: Payer: Self-pay | Admitting: Family Medicine

## 2021-12-15 NOTE — Telephone Encounter (Signed)
Please talk to me about this.  Thanks.  

## 2021-12-15 NOTE — Telephone Encounter (Signed)
Jeffrey Church called in due to he was sent a bill for labs he had done on 06/25/21 and had questions

## 2021-12-15 NOTE — Telephone Encounter (Signed)
They are denying coverage, they stated that if it was diagnostic, or injury consultation then it would be covered but due to its coming up as administrative they are

## 2021-12-29 NOTE — Telephone Encounter (Addendum)
Please see if the billing can be redone with another code for preventive wellness.  I am okay with that.  Please update me and the patient.  Thanks.  ?

## 2021-12-29 NOTE — Telephone Encounter (Signed)
Pt called stating that Christella Scheuermann states that the code should be Medical Diagnoses or Preventive Wellness. Pt states that his bill is coming soon. Pt would like a call back at [phone] to discuss.  Please advise. ?

## 2021-12-31 NOTE — Telephone Encounter (Signed)
Pt called to follow-up on this issue. He has a bill for over $900 for the lab work that was done. Pt states that the labs need to be coded with either a medical diagnosis code or preventative wellness code to be covered, according to [facility].  ?Pt would like a call back when this has been handled or at least an update to it so he doesn't have to call back again.  ? ?

## 2022-01-05 NOTE — Telephone Encounter (Signed)
Spoke to Jeffrey Church today. The bill in question is from [employer] ? ?I reached out to [facility] today via email to see how we can get the coding adjusted. ? ?Informed patient that I would follow-up with him on Friday  of this week with next steps. If he doesn't hear from me, he will call me. ? ?Response from [facility]: Hello Amy, ?I will send a message to billing to see how their insurance processed the claim. ?I will let you know, it usually takes a few days. ? ?

## 2022-01-07 NOTE — Telephone Encounter (Signed)
Bill Summary - [id number] - ATL Christiane Ha Weidmann ?  ?Patient was billed $928.01 for date of service 06/25/21 because insurance had denied all of the test as not covered service for below test,  Originally Invoice was billed with diagnosis code : Z029  ?  ?We received call yesterday 01/06/22 from Gypsy Decant /Assistant dirctor who add diagnosis code [phone] and invoice is rebilled to insruance back ?Please allow up to 60days to receive respond back meanwhiel patient invoice been place on hold during this time. ? ?Lvm on patient's phone with updated information provided above. ?

## 2022-02-18 ENCOUNTER — Telehealth: Payer: Self-pay

## 2022-02-18 NOTE — Telephone Encounter (Signed)
Patient and mother walked into office today stating that patient is traveling to the [place] in June and that he needs hep A, B, and typhoid vaccines and a Z-pak incase he gets sick while abroad. He would like to know if you can send those orders to [facility] in [place], [place] so he doesn't have to travel back to the office. His best contact phone number is [phone].  ?

## 2022-02-20 MED ORDER — AZITHROMYCIN 500 MG PO TABS: 500.0000 mg | ORAL_TABLET | Freq: Every day | ORAL | 0 refills | Status: AC

## 2022-02-20 MED ORDER — TYPHOID VACCINE PO CPDR: 1.0000 | DELAYED_RELEASE_CAPSULE | ORAL | 0 refills | Status: AC

## 2022-02-20 MED ORDER — HAVRIX 1440 EL U/ML IM SUSP: INTRAMUSCULAR | 1 refills | Status: AC

## 2022-02-20 NOTE — Addendum Note (Signed)
Addended by: Tonia Ghent on: 02/20/2022 06:35 PM ? ? Modules accepted: Orders ? ?

## 2022-02-20 NOTE — Telephone Encounter (Signed)
I sent the rx for HAV and typhoid vaccines.  Sent rx for zmax.   He is already vaccinated for HBV and doesn't need to repeat that.   ? ?How long is he going to be there and where is he travelling?  Please let me know because it may affect med use re: malaria prophylaxis.  Thanks.  ?

## 2022-02-21 NOTE — Telephone Encounter (Signed)
Spoke to patient and advised about below and he will be gone for 1 week to [place]. ?

## 2022-02-22 NOTE — Telephone Encounter (Signed)
No malaria transmission in [place] or other urban?areas, so he doesn't need malaria prophylaxis.  Thanks.  ?

## 2022-02-22 NOTE — Telephone Encounter (Signed)
Patient aware.

## 2022-03-03 NOTE — Telephone Encounter (Signed)
lmtcb

## 2022-03-03 NOTE — Telephone Encounter (Signed)
[facility] Night - Client ?Nonclinical Telephone Record  ?[facility]? ?Client [facility] Night - Client ?Client Site [facility] - Night ?Provider Raechel Ache - MD ?Contact Type Call ?Who Is Calling Patient / Member / Family / Caregiver ?Caller Name Jeffrey Church ?Caller Phone Number [phone] ?Patient Name Jeffrey Church ?Patient DOB 05-25-1996 ?Call Type Message Only Information Provided ?Reason for Call Request for General Office Information ?Initial Comment Caller is asking if vaccination information was sent to pharmacy or does he need to have them ?sent again? Please mail records to Pt as well for his records. ?Additional Comment Caller will call again during regular business hours. ?Disp. Time Disposition Final User ?03/02/2022 5:05:26 PM General Information Provided Yes Winn Jock ?Call Closed By: Winn Jock ?Transaction Date/Time: 03/02/2022 5:00:17 PM (ET ?

## 2022-03-08 NOTE — Telephone Encounter (Signed)
Left v/m requesting cb to find out what immunization pt is needing. Sending note to [facility]. ?

## 2022-03-08 NOTE — Telephone Encounter (Signed)
[facility] Night - Client ?Nonclinical Telephone Record  ?[facility]? ?Client [facility] Night - Client ?Client Site [facility] - Night ?Provider Raechel Ache - MD ?Contact Type Call ?Who Is Calling Patient / Member / Family / Caregiver ?Caller Name Jeffrey Church ?Caller Phone Number [phone] ?Patient Name Jeffrey Church ?Patient DOB 1996-01-17 ?Call Type Message Only Information Provided ?Reason for Call Medication Question / Request ?Initial Comment Caller states they need a vaccination ordered. ?Additional Comment They need it sent to the [facility]. The number to [facility] is [phone]. ?Disp. Time Disposition Final User ?03/07/2022 5:05:36 PM General Information Provided Yes Riggins, Giselle ?Call Closed By: Justice Rocher ?Transaction Date/Time: 03/07/2022 5:02:42 PM (ET ?

## 2022-03-09 NOTE — Telephone Encounter (Signed)
Called patient and discussed vaccine needed for his trip. He stated he was able to get the Hep A that was sent and the two rxs that were sent. He was mixed up with what he thought he needed and everything is fine now. Nothing further needed at this time.  ?

## 2023-05-08 ENCOUNTER — Telehealth: Payer: Self-pay | Admitting: Family Medicine

## 2023-05-08 NOTE — Telephone Encounter (Signed)
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 He was wanting this prescription for himself or others around. He is wanting a prescription for a vial instead of a pen. He can be reached at [phone]. Thank you!

## 2023-05-08 NOTE — Telephone Encounter (Signed)
Patient called in and was wanting to know if Dr. Para March could send in a prescription for epi. H e stated that he goes hiking a lot and just want to be prepared. Thank you!

## 2023-05-09 MED ORDER — EPINEPHRINE 0.3 MG/0.3ML IJ SOAJ: 0.3000 mg | INTRAMUSCULAR | 1 refills | Status: AC | PRN

## 2023-05-09 NOTE — Addendum Note (Signed)
Addended by: Joaquim Nam on: 05/09/2023 07:56 AM   Modules accepted: Orders

## 2023-05-09 NOTE — Addendum Note (Signed)
Addended by: Wendie Simmer B on: 05/09/2023 04:46 PM   Modules accepted: Orders

## 2023-05-09 NOTE — Telephone Encounter (Signed)
Tried to call patient; no answer and could not leave a message.

## 2023-05-09 NOTE — Telephone Encounter (Signed)
I would recommend sending the pen instead of vial- it is much safer and easier to use.  If he is okay with that, please send it in.  Thanks.

## 2023-05-09 NOTE — Telephone Encounter (Signed)
Spoke with patient and he was okay with having the pens sent in. Erx sent.

## 2024-11-29 ENCOUNTER — Other Ambulatory Visit: Payer: Self-pay | Admitting: Family Medicine
